# Patient Record
Sex: Female | Born: 1970 | Race: Black or African American | Hispanic: No | Marital: Married
Health system: Southern US, Community
[De-identification: ages and names within clinical notes are randomized; demographics above are authoritative.]

---

## 2013-06-13 ENCOUNTER — Observation Stay: Payer: Self-pay | Admitting: Internal Medicine

## 2013-06-13 LAB — TSH: Thyroid Stimulating Horm: 2.05 u[IU]/mL

## 2013-06-13 LAB — COMPREHENSIVE METABOLIC PANEL
Albumin: 3.7 g/dL (ref 3.4–5.0)
Alkaline Phosphatase: 103 U/L (ref 50–136)
Anion Gap: 5 — ABNORMAL LOW (ref 7–16)
Bilirubin,Total: 0.4 mg/dL (ref 0.2–1.0)
Calcium, Total: 9.2 mg/dL (ref 8.5–10.1)
Creatinine: 1.07 mg/dL (ref 0.60–1.30)
Glucose: 58 mg/dL — ABNORMAL LOW (ref 65–99)
Potassium: 3.7 mmol/L (ref 3.5–5.1)
SGPT (ALT): 27 U/L (ref 12–78)
Total Protein: 8.1 g/dL (ref 6.4–8.2)

## 2013-06-13 LAB — URINALYSIS, COMPLETE
Leukocyte Esterase: NEGATIVE
Nitrite: NEGATIVE
Ph: 7 (ref 4.5–8.0)
Specific Gravity: 1.009 (ref 1.003–1.030)
Squamous Epithelial: 1
WBC UR: 1 /HPF (ref 0–5)

## 2013-06-13 LAB — CBC
HCT: 40.3 % (ref 35.0–47.0)
MCH: 27.5 pg (ref 26.0–34.0)
MCV: 79 fL — ABNORMAL LOW (ref 80–100)
Platelet: 300 10*3/uL (ref 150–440)
RBC: 5.12 10*6/uL (ref 3.80–5.20)
WBC: 3.5 10*3/uL — ABNORMAL LOW (ref 3.6–11.0)

## 2013-06-13 LAB — TROPONIN I: Troponin-I: <0.02 ng/mL

## 2014-02-06 IMAGING — CT CT HEAD WITHOUT CONTRAST
1 of 2 series · 16 of 30 positions shown, 20 images · non-contrast
Comparison: none

REASON FOR EXAM: trouble with speech
COMMENTS:

PROCEDURE:     CT  - CT HEAD WITHOUT CONTRAST  - June 13, 2013  [DATE]
RESULT:     History: Speech difficulty.
Comparison Study: No prior.

[Series 2: soft tissue · axial · 0.47mm/px · z∈[+1254,+1390]mm · 16 of 31 slices shown, 20 images]
[im 2/31  brain]
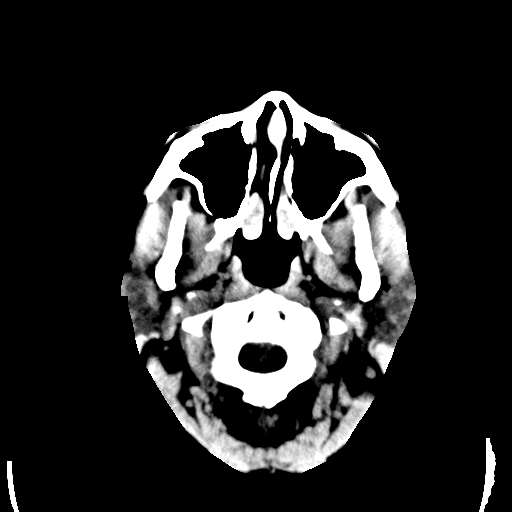
[im 2/31  bone]
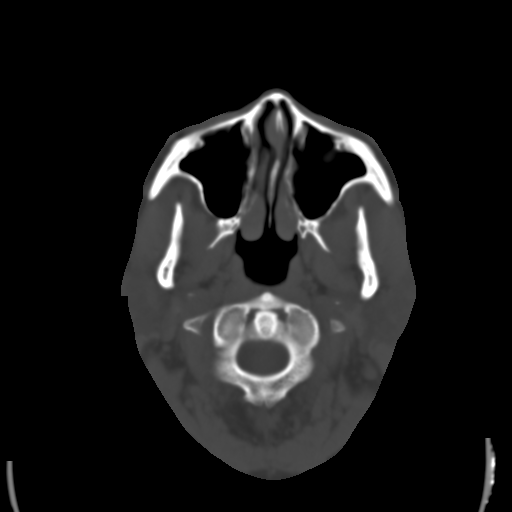
[im 4/31  brain]
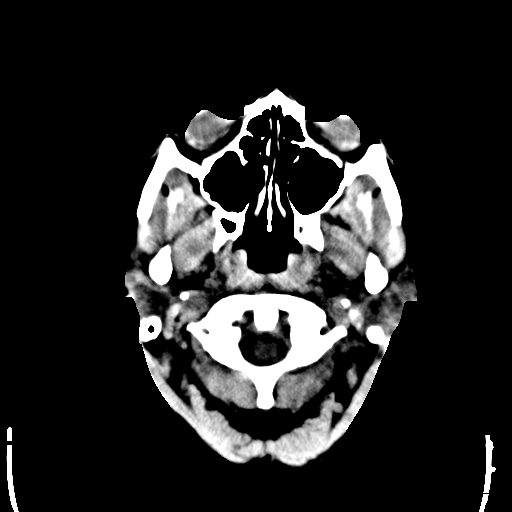
[im 6/31  brain]
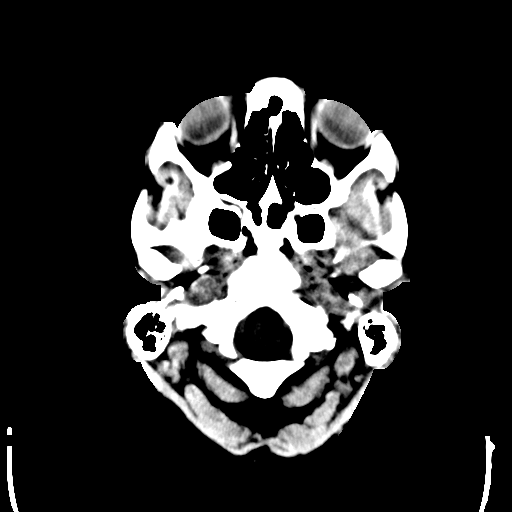
[im 8/31  brain]
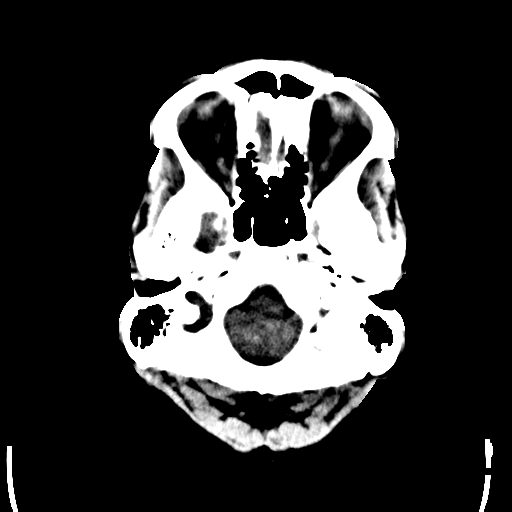
[im 9/31  brain]
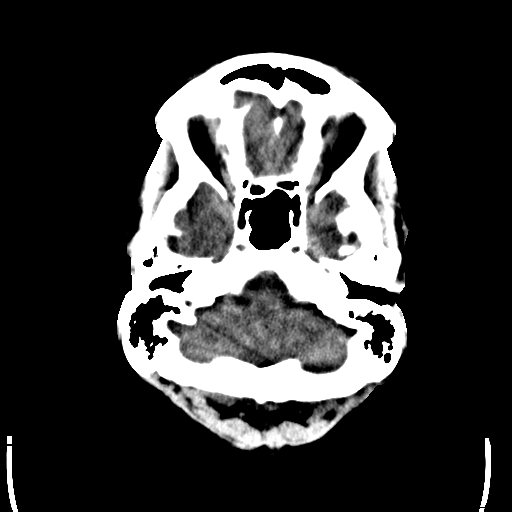
[im 9/31  bone]
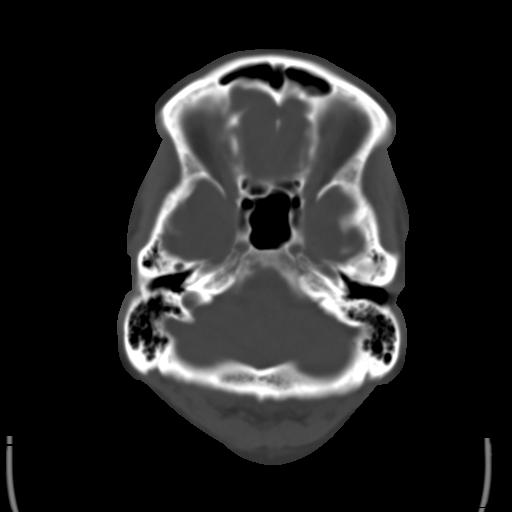
[im 11/31  brain]
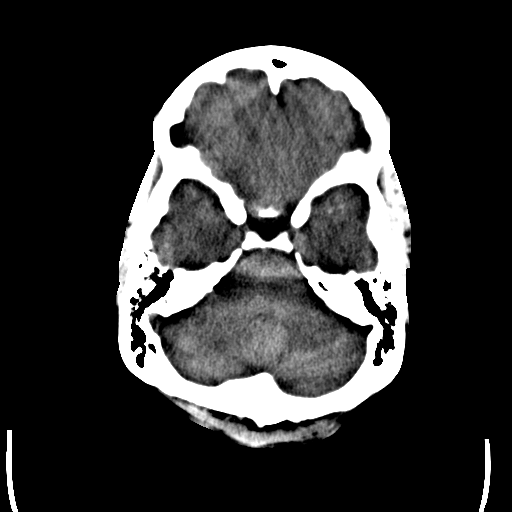
[im 13/31  brain]
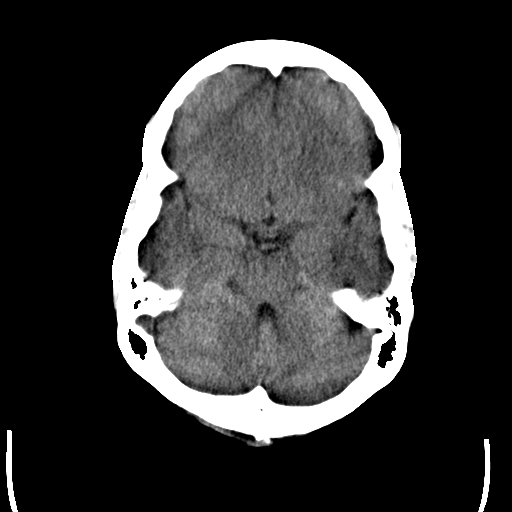
[im 14/31  brain]
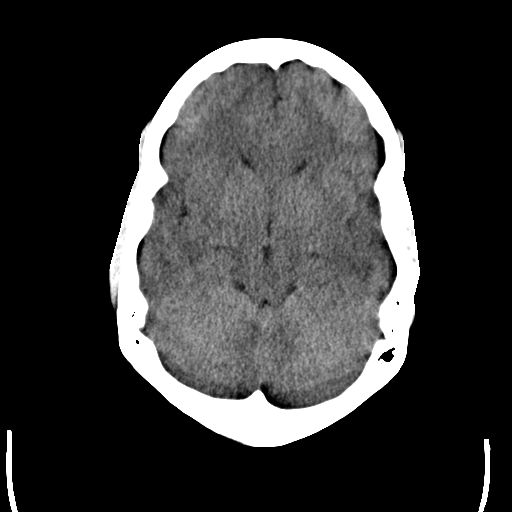
[im 17/31  brain]
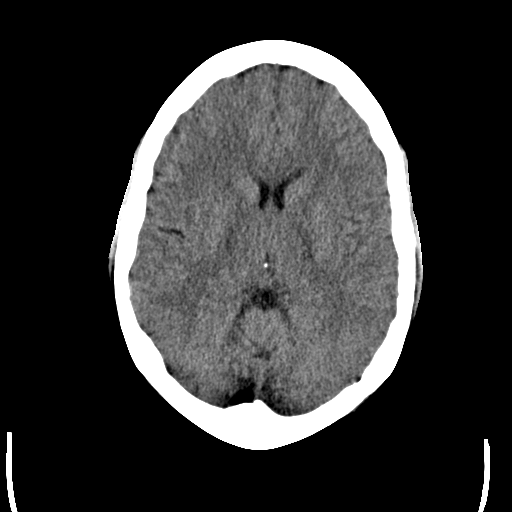
[im 17/31  bone]
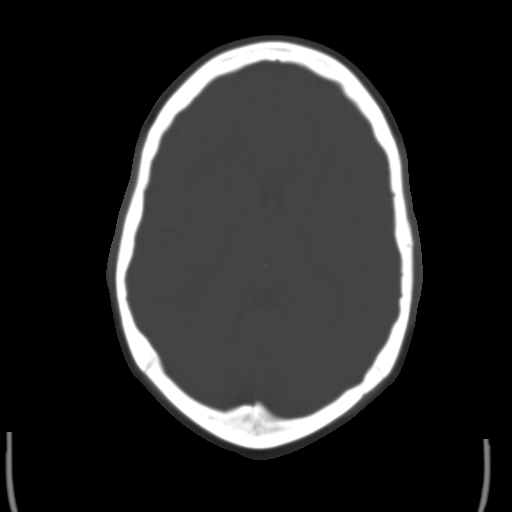
[im 18/31  brain]
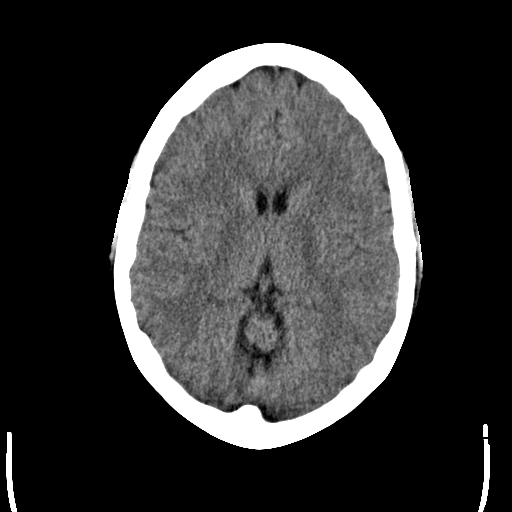
[im 21/31  brain]
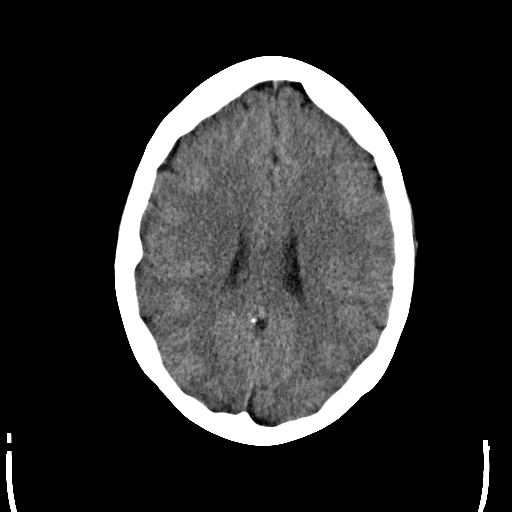
[im 22/31  brain]
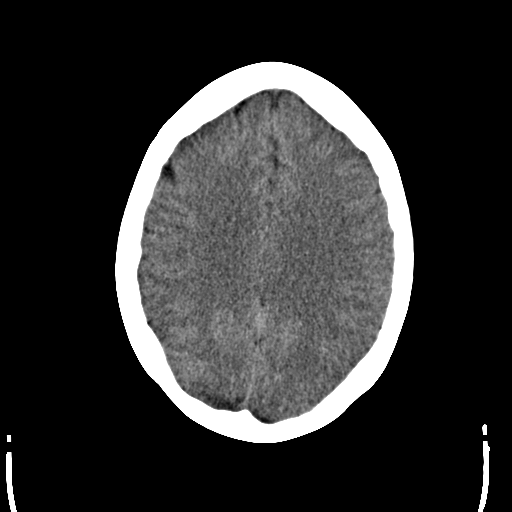
[im 23/31  brain]
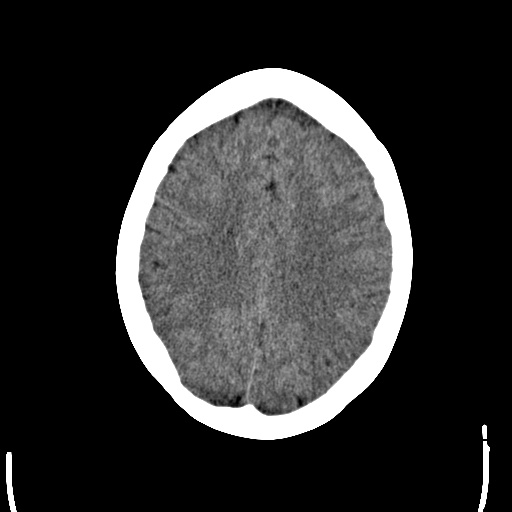
[im 23/31  bone]
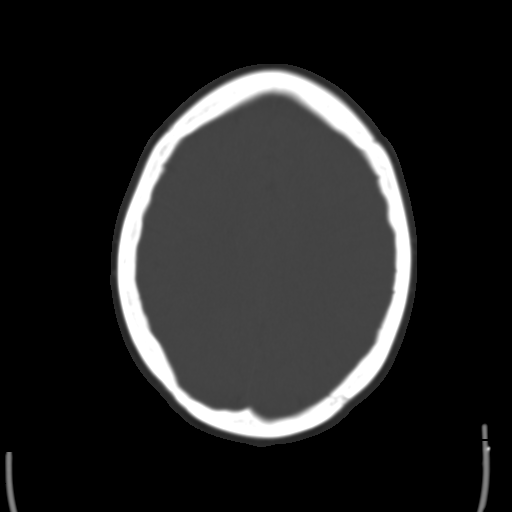
[im 26/31  brain]
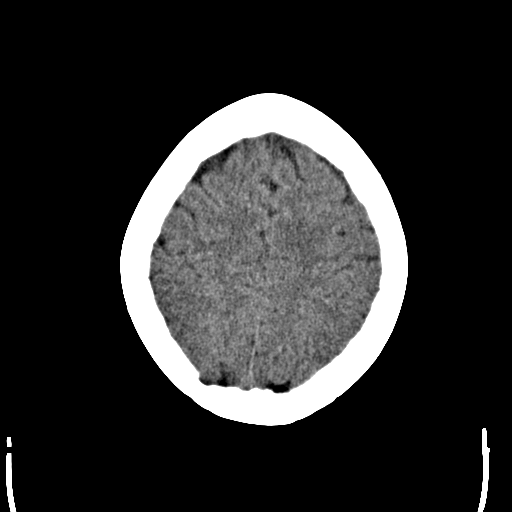
[im 27/31  brain]
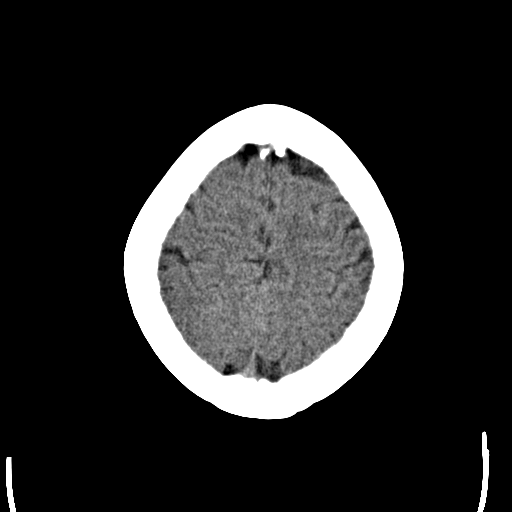
[im 29/31  brain]
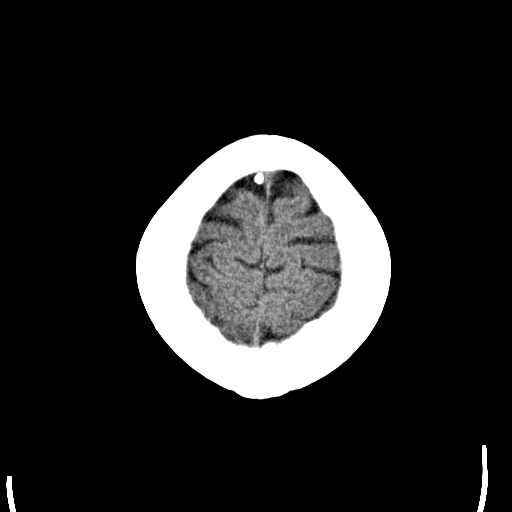

[16 of 30 positions shown; findings below may reference images not displayed]

FINDINGS: No mass. No hydrocephalus. No hemorrhage. No acute bony
abnormality.
IMPRESSION: No acute abnormality.

## 2015-03-05 NOTE — Consult Note (Signed)
PATIENT NAME:  Whitney Alexander, Whitney Alexander MR#:  [id number] DATE OF BIRTH:  [birth year]  DATE OF CONSULTATION:  06/13/2013  REFERRING PHYSICIAN: Hilda Lias, M.D.   CONSULTING PHYSICIAN:  A. Wendall Mola, MD  CHIEF COMPLAINT: Hypoglycemia.   HISTORY OF PRESENT ILLNESS: This is a 44 year old female with a history of hypertension and post surgical hypothyroidism, who was admitted today for hypoglycemia. She resides in [place] and had been visiting her mother in [place]. She had been driving on her way to return to [place] today when she had onset of lightheadedness with the sense she was going to pass out. She had eaten a fairly full breakfast within a few hours prior. She did not have any nausea. She did not have any chest pain. EMS responded and at the scene apparently she had a blood sugar in the 40s. She was given glucagon ad transported to the ER. In the ER, she was also found to have a blood sugar in the 50s, so she received several amps of IV dextrose and despite that continued to have mildly low blood sugar. It is noted that initial blood sugar on arrival at 7:50 was 74, 8:00 a.m. was 58, 8:30 a.m. was 65, 9:40 a.m. was 56 and at 10:20 a.m. was 56. She was then put on IV continuous dextrose drip and her blood sugar improved to 123. These blood sugars were assessed both on fingerstick and venous blood sugar testing. She had a normal TSH of 2.05 and otherwise fairly unremarkable labs. She had been feeling fairly well in recent weeks, although does report some episodic dizziness. Her weight has been stable. She reports in the last 6 months, she has perhaps gained 18 pounds, but had lost about 10 pounds in the months prior. She denies any nausea. She denies any use of alcohol. She denies any use of insulin or non-prescribed medications. She denies exposure to herbal medications. She does recall an episode several years ago where she had again sense of near syncope and was found to have a low blood  sugar. Apparently, that self resolved and did not require any further management.   PAST MEDICAL HISTORY:  1.  Post surgical hypothyroidism.  2.  Hypertension.  3.  Sickle cell trait.  4.  Asthma.   PAST SURGICAL HISTORY:   1.  Surgery for ectopic pregnancy.  2.  Hammer foot repair.  3.  Partial thyroidectomy, 2008.  MEDICATIONS:   1.  Armour Thyroid 30 mg daily.  2.  Triamterene 25 mg daily.  4.  Albuterol inhaler 2 puffs as needed.   ALLERGIES: CODEINE.   FAMILY HISTORY: Her father, uncle, grandparent and aunt all have diabetes. She has had relatives pass away from complications of diabetes. Also positive for breast cancer and hypertension.   SOCIAL HISTORY: The patient is married. She lives in [place]. She denies use of tobacco or alcohol. She denies illicit drug use.   REVIEW OF SYSTEMS: GENERAL: No fevers. She has had some swings in her weight and most recently, perhaps has gained about 18 pounds in the last 6 months. HEENT: Denies blurred vision. Denies sore throat. NECK: Denies dysphagia or odynophagia. CARDIAC: Denies chest pain or palpitations. PULMONARY: Denies cough or shortness of breath. ABDOMEN: Denies abdominal pain. Denies nausea. EXTREMITIES: Denies leg swelling. SKIN: Reports a recurrent rash between the toes of the left foot. She denies any other skin changes. Denies pruritus. ENDOCRINE: Denies heat or cold intolerance. GENITOURINARY: Denies dysuria or hematuria. HEMATOLOGIC: Denies easy bruisability  or recent bleeding.   PHYSICAL EXAMINATION:  VITAL SIGNS: Height 71.9 inches, weight 208 pounds, BMI 28, temperature 98.3, pulse 88, respirations 18, blood pressure 114/77 and pulse oximetry 100% on room air.  GENERAL: Well-developed, African American female in no acute distress.  HEENT: EOMI. Oropharynx is clear. Mucous membranes moist.  NECK: Supple. There is linear scar of the lower neck. Thyroid not palpable.  LYMPH: No submandibular or anterior cervical  lymphadenopathy.  CARDIAC: Regular rate and rhythm.  PULMONARY: Clear to auscultation bilaterally. No wheeze or rhonchi.  ABDOMEN: Diffusely soft, nontender. Positive bowel sounds.  EXTREMITIES: No edema is present. Full range of motion.  SKIN: No rash is appreciated on the foot or otherwise. No dermatopathy is present.  PSYCHIATRIC: Alert and oriented, calm and cooperative.  NEUROLOGIC: No tremor is present. Cranial nerves are intact.   LABORATORY DATA: Glucose 58, BUN 10, creatinine 1.0, sodium 139, potassium 3.7, chloride 106, bicarbonate 28, calcium 9.2, total protein 8.1, AST 20, ALT 27, albumin 3.7. Troponin I less than 0.02. TSH 2.05. WBC 3.5, hematocrit 40.3%, platelets 300. Urinalysis negative for glucose, bilirubin, ketones, protein, nitrate, and leukocyte esterase.   RADIOLOGY: CT head, noncontrast, showed no acute abnormality.   ASSESSMENT: A 44 year old female generally in good health, who had an episode today of severe unprovoked hypoglycemia. The cause of hypoglycemia is not clear. She has not had exposure to medications, which can lead to hypoglycemia. She has no renal disease, liver disease or recent infections.   RECOMMENDATIONS:  1.  Continue fingerstick blood sugar checks q.1 hour.  2.  Keep n.p.o.  3.  To elucidate cause of hypoglycemia, we need to reproduce hypoglycemia when symptomatic (Whipple's triad), therefore, once blood sugars are less than 60 and she has symptoms such as shakes, sweats or confusion or if blood sugar less than 50, we will draw the following labs: Glucose, C-peptide, insulin, beta hydroxybutyrate and sulfonylurea screen. Once labs are drawn, we will treat her hypoglycemia and likely start a regular diet. Labs are instrumental in elucidating the cause of hypoglycemia. For instance, hypoinsulinemic hypoglycemia typically seen in chronic disease whereas hyperinsulinemic hypoglycemia would suggest potentially insulinoma, nesidioblastosis or autoimmune.  4.   Continue on treatment of hypothyroidism and hypertension without patient medications as you are.   Thanks for the kind request for consultation. I will be unavailable to see the patient over the weekend; however, will be reachable by page if there are questions or concerns. I expect that if her blood sugars do remain stable while she is n.p.o. over the evening and into tomorrow morning, then she can likely be discharged for further followup with her primary care physician.  ____________________________ A. Wendall MolaMelissa Tkai Large, MD ams:aw D: 06/13/2013 14:08:43 ET T: 06/13/2013 14:41:55 ET JOB#: [id number]  cc: A. Wendall MolaMelissa Torie Priebe, MD, <Dictator> Macy MisA. MELISSA Gali Spinney MD ELECTRONICALLY SIGNED 06/18/2013 17:01

## 2015-03-05 NOTE — Consult Note (Signed)
Chief Complaint and History:  Referring Physician Dr. Cherlynn KaiserSainani   Chief Complaint hypoglycemia   Allergies:  Codeine: Rash  Tomato: Rash  Assessment/Plan:  Assessment/Plan 44 yo F with h/o HTN and post-surgical hypothyroidism seen in consultation for hypoglycemia. Pt was interviewed, examined and chart was reviewed. She had an episode this morning with BG in the 40s and symptoms of lightheadeness. She denies use of sulfonylureas or insulin. No recent exposure to herbal medications. Eating well and weight fairly stable. No recent weight loss. No h/o GI surgeries. Currently asymptomatic. No signs/symptoms of infection. Labs otherwise unremarkable.  A/ Hypoglycemia - cause unclear  P/ -Plan to keep pt NPO and hold IV dextrose. Will check GSBS q1 hour. Will obtain labs if FSBS <50 or <60 with symptoms of shakes/sweats/confusion to include: glucose, c-peptide, insulin, Beta-hydroxybutyrate, and sulfonylurea screen. After labs are drawn, she can be fed and supplemental dextrose can be started if needed. -If after a period of observation tonight and tomorrow morning, her sugars remain stable, then team could consider DC home with F/U with PCP.  I will be available over the weekend by page at [phone].  A full consult will be dictated.   Case Discussed With patient, family, attending physician   Electronic Signatures: Raj JanusSolum, Anna M (MD)  (Signed 01-Aug-14 13:59)  Authored: Chief Complaint and History, ALLERGIES, Assessment/Plan   Last Updated: 01-Aug-14 13:59 by Raj JanusSolum, Anna M (MD)

## 2015-03-05 NOTE — H&P (Signed)
PATIENT NAME:  Whitney Alexander, BORDELON MR#:  [id number] DATE OF BIRTH:  November 29, 1970  DATE OF ADMISSION:  06/13/2013  PRIMARY CARE PHYSICIAN: Located in [place].   CHIEF COMPLAINT: Dizziness, weakness and slurred speech.   HISTORY OF PRESENT ILLNESS: This is a 44 year old female who presents to the Emergency Room, brought in by EMS due to slurred speech and dizziness and also weakness. The patient says that she is visiting her mother from [place], was in [place], was driving back to [place] today when on the road, she started feeling dizzy, lightheaded and thought she was going to black out. She pulled over to the side of the highway. She attempted to call her mother who did not answer. She then called 911, but was not able to get words out appropriately. She has had symptoms like this before a few years back when she was noted to be hypoglycemic. The patient, when EMS arrived, was noted to have a blood sugar in the 40s. She received some glucagon and her sugars did improve slightly and her lethargy and weakness also improved. The patient was brought to the ER and noted to have a blood sugar in the 50s still. She received some more dextrose here in the ER, but continues to be persistently hypoglycemic. Initially, there was some concern for possible CVA, although her symptoms are  likely to be related to hypoglycemia. Hospitalist services were guarded for further treatment and evaluation.   REVIEW OF SYSTEMS:  CONSTITUTIONAL: No documented fever. No weight gain or weight loss.  EYES: No blurred or double vision.  ENT: No tinnitus. No postnasal drip. No redness of the oropharynx.  RESPIRATORY: No cough, no wheeze, no hemoptysis, no dyspnea.  CARDIOVASCULAR: No chest pain, no orthopnea, no palpitations, no syncope. GASTROINTESTINAL: No nausea, vomiting, diarrhea. No abdominal pain, no melena or hematochezia.  GENITOURINARY: No dysuria, no hematuria.  ENDOCRINE: No polyuria or nocturia. No  heat or cold intolerance.  HEMATOLOGIC: No anemia, no bruising, no bleeding.  INTEGUMENTARY: No rashes. No lesions.  MUSCULOSKELETAL: No arthritis, no swelling, no gout.  NEUROLOGIC: No numbness or tingling. No ataxia. No seizure-type activity.  PSYCHIATRIC: No anxiety, no insomnia. No ADD.   PAST MEDICAL HISTORY: Consistent with hypertension, asthma, hypothyroidism.   ALLERGIES: CODEINE AND TOMATO.   SOCIAL HISTORY: No smoking. No alcohol abuse. No illicit drug abuse. Lives at home with her husband.   FAMILY HISTORY: Mother is alive, has breast cancer. Father died from complications of diabetes.   CURRENT MEDICATIONS: As follows: Armour Thyroid 30 mg daily, Symbicort 160/4.5 two  puffs b.i.d., albuterol inhaler 2 puffs q. 4 to 6 hours as needed.   PHYSICAL EXAMINATION: On admission is as follows: VITAL SIGNS:  Are noted to be temperature 98.1, pulse 80, respirations 18, blood pressure 134/84, sats 100% on room air.  GENERAL: She is a pleasant -appearing female in no apparent distress.  HEENT: Atraumatic, normocephalic. Extraocular muscles are intact. Pupils equal and reactive to light. Sclerae anicteric. No conjunctival injection. No pharyngeal erythema.  NECK: Supple. There is no jugular venous distention, no bruits, no lymphadenopathy, no thyromegaly.  HEART: Regular rate and rhythm. No murmurs. No rubs. No clicks.  LUNGS: Clear to auscultation bilaterally. No rales or rhonchi. No wheezes.  ABDOMEN: Soft, flat, nontender, nondistended. Has good bowel sounds. No hepatosplenomegaly appreciated.  EXTREMITIES: No evidence of any cyanosis, clubbing, or peripheral edema. Has +2 pedal and radial pulses bilaterally.  NEUROLOGICAL: The patient is alert, awake and oriented x3 with no  focal motor or sensory deficits appreciated bilaterally.  SKIN: Moist and warm with no rash appreciated.  LYMPHATIC: There is no cervical or axillary lymphadenopathy.   LABORATORY: Showed a serum glucose of 58,  BUN 10, creatinine 1.07, sodium 139, potassium 3.7, chloride 106, bicarbonate 28, LFTs within normal limits. Troponin less than 0.02. TSH 2.05. White cell count 3.5, hemoglobin 14.1, hematocrit 40.3, platelet count 300. Urinalysis is within normal limits.   The patient did have a CT of the head done without contrast, which showed no acute intracranial process.   ASSESSMENT AND PLAN: This is a 44 year old female with history of hypothyroidism, hypertension, asthma, presents to the hospital due to dizziness, slurred speech and lethargy and noted to be hypoglycemic.   PROBLEMS: 1.  Recurrent hypoglycemia. The exact etiology of this is unclear. The patient has no previous history of diabetes, but does have a strong family history of diabetes. She is currently on no medications and no oral hypoglycemics. The hypoglycemia is likely the cause of the patient's slurred speech, lethargy and dizziness. I do not suspect any acute intracranial process as her neurological exam is nonfocal and benign. Despite getting multiple doses of dextrose, her sugar keeps dropping down. Questionable if there is underlying insulinoma. I discussed the case with Dr. Tedd SiasSolum from endocrinology, who will evaluate the patient later today. She recommended keeping the patient n.p.o. for now so she can get fasting blood work done. For now we will  follow q. 1 hour Accu-Cheks.   2.  Hypertension, presently hemodynamically stable. Continue triamterene and HCTZ.   3.  Asthma. No evidence of acute exacerbation. Continue Symbicort and p.r.n. albuterol.   CODE STATUS: THE PATIENT IS A FULL CODE.   TIME SPENT: 50 minutes.    ____________________________ Rolly PancakeVivek J. Cherlynn KaiserSainani, MD vjs:dp D: 06/13/2013 11:26:23 ET T: 06/13/2013 11:48:37 ET JOB#: [id number]  cc: Rolly PancakeVivek J. Cherlynn KaiserSainani, MD, <Dictator> Houston SirenVIVEK J Korby Ratay MD ELECTRONICALLY SIGNED 06/14/2013 15:11

## 2015-03-05 NOTE — Discharge Summary (Signed)
PATIENT NAME:  Whitney Alexander, Whitney Alexander MR#:  [id number] DATE OF BIRTH:  Mar 19, 1971  DATE OF ADMISSION:  06/13/2013 DATE OF DISCHARGE:  06/14/2013  ADMITTING DIAGNOSES: Dizziness, weakness, slurred speech.   DISCHARGE DIAGNOSES: 1.  Dizziness, weakness, slurred speech, due to hypoglycemia; now symptoms resolved.  2. Hypoglycemia; etiology unclear. We want the patient to have blood sugars at 50 for checking her blood work, per endocrinology. The patient's blood sugars did not reach that low, and she stated that she had to leave because she is from [place], so she will have to have further workup in [place] and see an endocrinologist.  3.  Hypertension.  4.  History of asthma.  5.  Hypothyroidism.   CONSULTANTS: Dr. Gabriel Carina.   PERTINENT LABS AND EVALUATIONS: Admitting glucose 58, BUN 10, creatinine 1.07, sodium 139, potassium 2.7, chloride 106, CO2 was 28, calcium 9.2.   LFTs were normal. WBC 3.5, hemoglobin 14.1, platelet count 300.   EKG: Normal sinus rhythm, without any ST-T wave changes.   CT scan of the head was normal.   HOSPITAL COURSE: Please refer to H and P done by the admitting physician.   The patient is a 44 year old African American female who presented to the ED brought in by EMS due to slurred speech, dizziness and weakness. The patient was visiting her mother from [place] and was in [place]. Was driving back to [place] today, went on the road. Started feeling dizzy, lightheaded. She felt like she was going to black out. She pulled over and called 911. When EMS arrived they noticed blood sugar to be 40. She received Glucagon, and blood sugars did improve slightly. Her blood sugars in the ED continued to be 50.    The patient reports that she had a recent episode a few days prior as well and was going to go see her primary care provider. It was unclear what the cause of her hypoglycemia was. She had eaten a 2 hours at breakfast. Her hemoglobin A1c was noted  to be 5.9. The patient was seen by  Dr. Gabriel Carina who wanted to do some blood work when her sugars dropped below 50 to check a glucose, C-peptide, or insulin, or if she had symptoms with blood sugars less than 60, however she did not have hypoglycemia with blood sugars reaching that low, and this morning she wanted to go home because she is from [place]. She stated that she will follow up with her primary care provider. I encouraged her not to drive. She reports that her friend will be picking her up.   At this time, she is stable for discharge.   DISCHARGE MEDICATIONS: Armour Thyroid 30 mg daily, hydrochlorothiazide, triamterene 25/37.5 p.o. daily, albuterol 2 puffs 4 times a day as needed, Symbicort 1 puff b.i.d., Glucagon emergency kit for low blood sugars, with symptoms to be used.   DIET: Low-sodium.   ACTIVITY: As tolerated.   FOLLOWUP: With primary MD next week, as well as endocrinology next week.   The patient recommended to eat 4 meals a day with snacks in between.    Note: Thirty-five minutes spent on the discharge.     ____________________________ Lafonda Mosses. Posey Pronto, MD shp:dm D: 06/15/2013 08:32:07 ET T: 06/15/2013 10:33:28 ET JOB#: [id number]  cc: Mykael Trott H. Posey Pronto, MD, <Dictator> Alric Seton MD ELECTRONICALLY SIGNED 06/23/2013 10:25
